# Patient Record
Sex: Female | Born: 1985 | Race: White | Hispanic: No | Marital: Single | Smoking: Never smoker
Health system: Southern US, Community
[De-identification: ages and names within clinical notes are randomized; demographics above are authoritative.]

## PROBLEM LIST (undated history)

## (undated) DIAGNOSIS — G4730 Sleep apnea, unspecified: Secondary | ICD-10-CM

## (undated) DIAGNOSIS — F419 Anxiety disorder, unspecified: Secondary | ICD-10-CM

## (undated) DIAGNOSIS — F32A Depression, unspecified: Secondary | ICD-10-CM

## (undated) DIAGNOSIS — F329 Major depressive disorder, single episode, unspecified: Secondary | ICD-10-CM

## (undated) DIAGNOSIS — M545 Low back pain, unspecified: Secondary | ICD-10-CM

## (undated) DIAGNOSIS — I219 Acute myocardial infarction, unspecified: Secondary | ICD-10-CM

## (undated) HISTORY — PX: WISDOM TOOTH EXTRACTION: SHX21

## (undated) HISTORY — DX: Acute myocardial infarction, unspecified: I21.9

---

## 2005-01-03 DIAGNOSIS — I219 Acute myocardial infarction, unspecified: Secondary | ICD-10-CM

## 2005-01-03 HISTORY — PX: CARDIAC CATHETERIZATION: SHX172

## 2005-01-03 HISTORY — DX: Acute myocardial infarction, unspecified: I21.9

## 2008-05-28 ENCOUNTER — Emergency Department (HOSPITAL_COMMUNITY): Admission: EM | Admit: 2008-05-28 | Discharge: 2008-05-28 | Payer: Self-pay | Admitting: Emergency Medicine

## 2010-04-13 LAB — POCT I-STAT, CHEM 8
BUN: 13 mg/dL (ref 6–23)
Creatinine, Ser: 0.7 mg/dL (ref 0.4–1.2)
Hemoglobin: 13.9 g/dL (ref 12.0–15.0)
Potassium: 3.3 mEq/L — ABNORMAL LOW (ref 3.5–5.1)
Sodium: 138 mEq/L (ref 135–145)
TCO2: 23 mmol/L (ref 0–100)

## 2010-04-13 LAB — POCT CARDIAC MARKERS
CKMB, poc: <1 ng/mL — ABNORMAL LOW (ref 1.0–8.0)
Myoglobin, poc: 38.4 ng/mL (ref 12–200)

## 2010-04-13 LAB — POCT PREGNANCY, URINE: Preg Test, Ur: NEGATIVE

## 2010-09-18 IMAGING — CR DG CHEST 2V
2 series · 2 of 2 positions shown · non-contrast
Comparison: None

CLINICAL DATA: Chest pain

CHEST - 2 VIEW

[w chest pa]
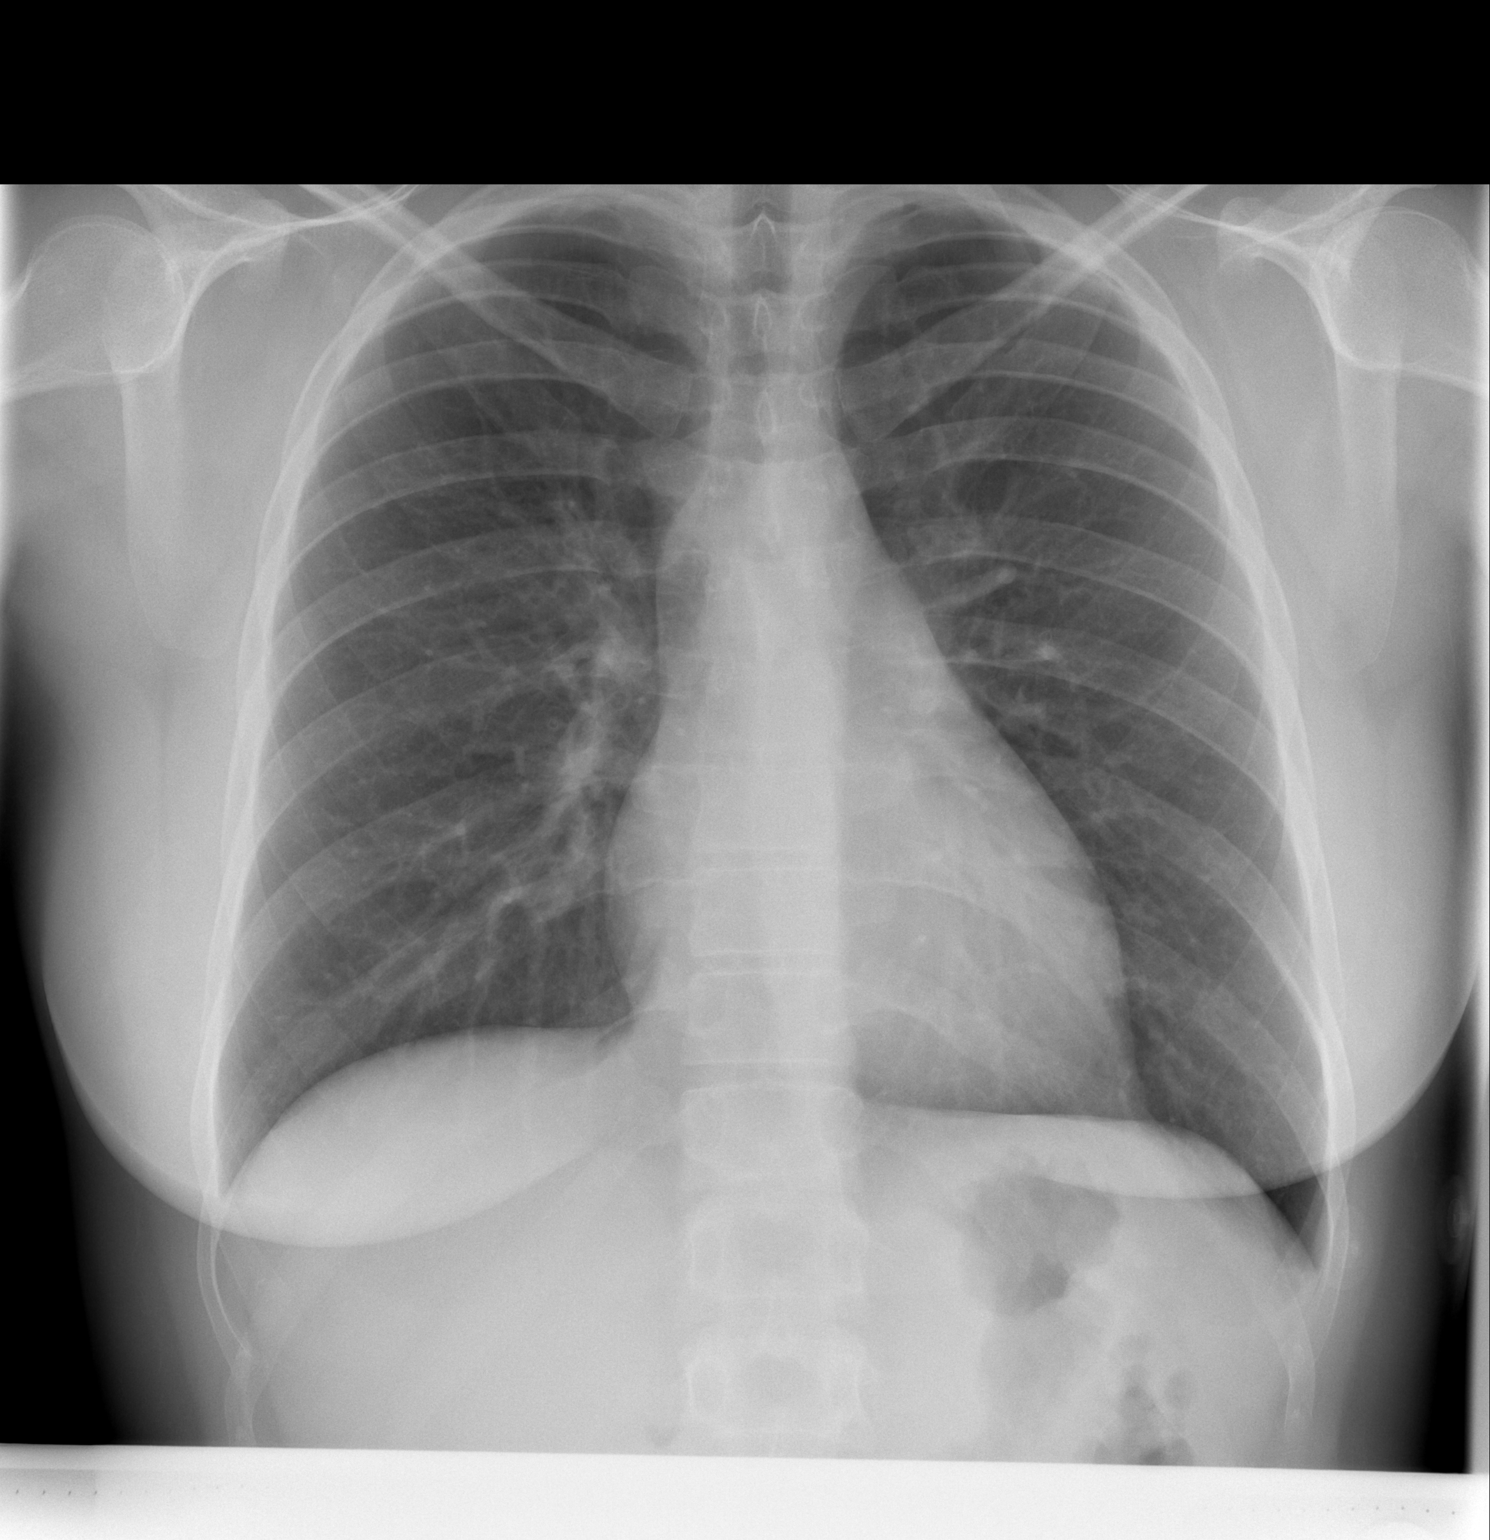

[w chest lat]
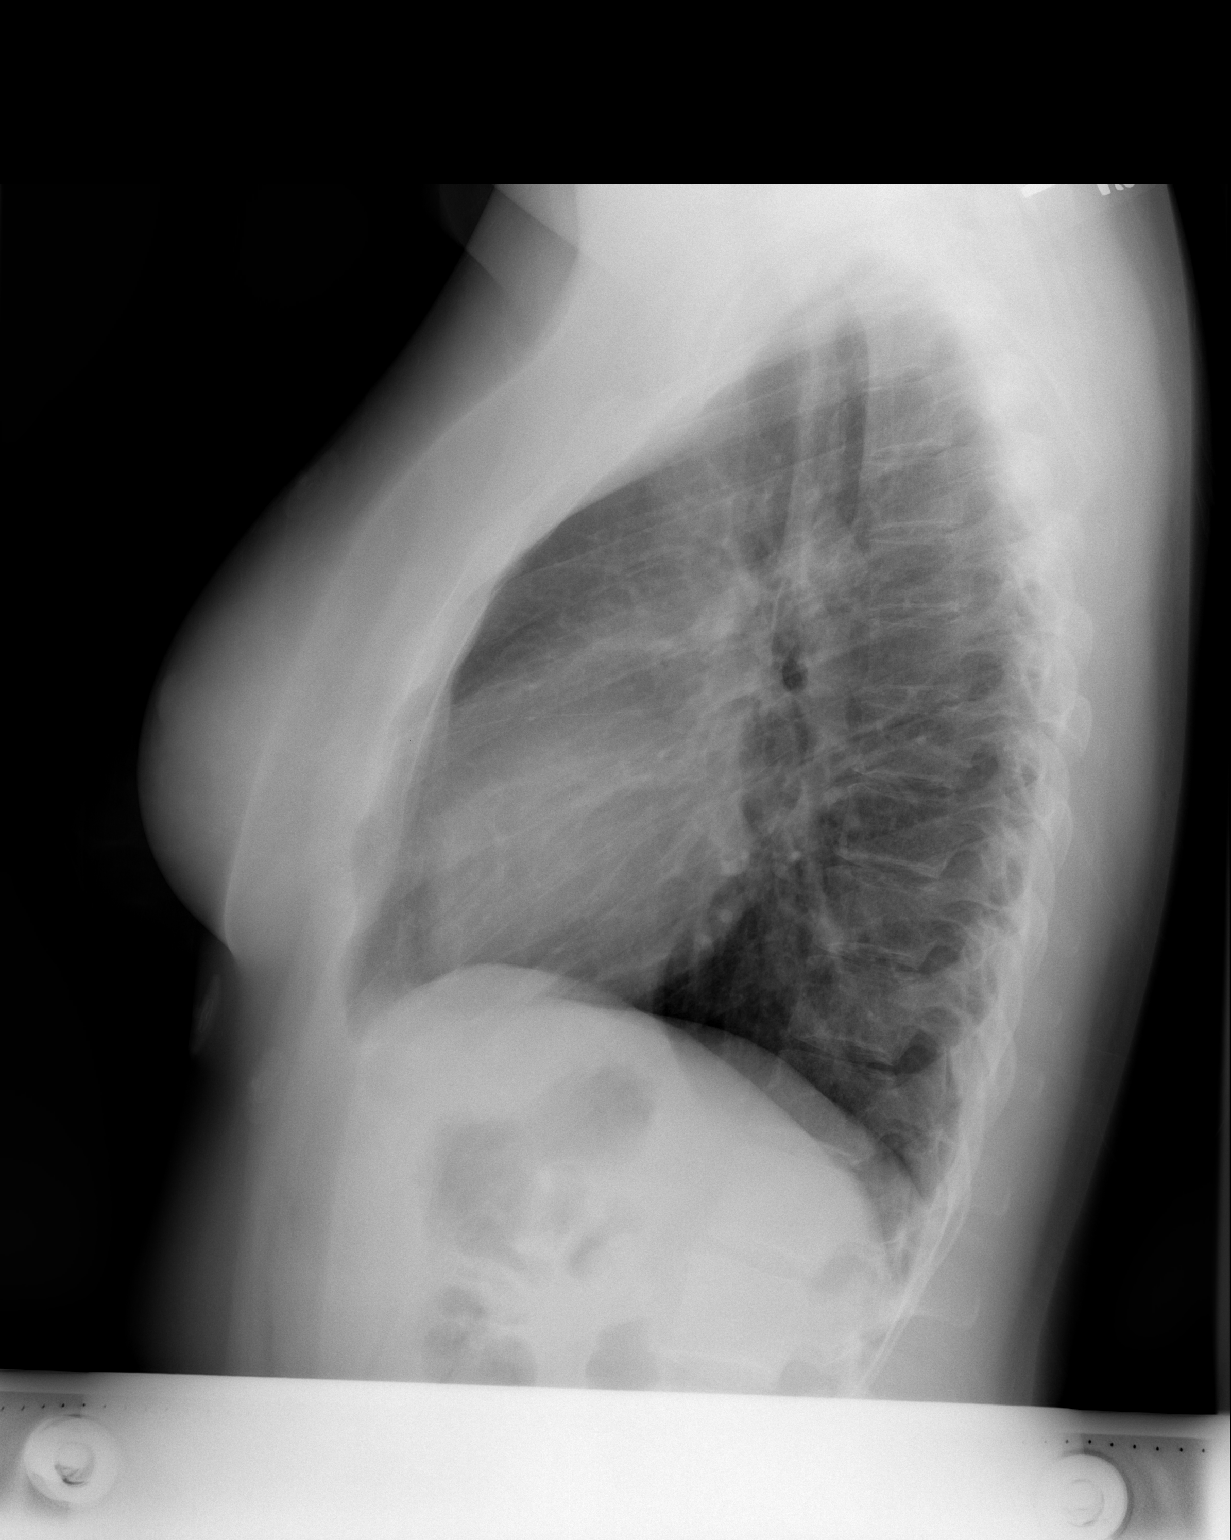

[2 of 2 positions shown; findings below may reference images not displayed]

FINDINGS: The abdomen and pelvis were shielded.

Heart and mediastinal contours normal.  Lungs clear.  No pleural
fluid.  Osseous structures and soft tissues unremarkable.
IMPRESSION: No acute or significant findings.

## 2013-10-07 ENCOUNTER — Ambulatory Visit (INDEPENDENT_AMBULATORY_CARE_PROVIDER_SITE_OTHER): Payer: PRIVATE HEALTH INSURANCE | Admitting: Physician Assistant

## 2013-10-07 VITALS — BP 122/76 | HR 84 | Temp 98.2°F | Resp 20 | Ht 61.5 in | Wt 186.0 lb

## 2013-10-07 DIAGNOSIS — R059 Cough, unspecified: Secondary | ICD-10-CM

## 2013-10-07 DIAGNOSIS — R109 Unspecified abdominal pain: Secondary | ICD-10-CM

## 2013-10-07 DIAGNOSIS — J069 Acute upper respiratory infection, unspecified: Secondary | ICD-10-CM

## 2013-10-07 DIAGNOSIS — R05 Cough: Secondary | ICD-10-CM

## 2013-10-07 MED ORDER — HYDROCOD POLST-CHLORPHEN POLST 10-8 MG/5ML PO LQCR: 5.0000 mL | Freq: Two times a day (BID) | ORAL | Status: DC | PRN

## 2013-10-07 MED ORDER — IPRATROPIUM BROMIDE 0.03 % NA SOLN: 2.0000 | Freq: Two times a day (BID) | NASAL | Status: DC

## 2013-10-07 MED ORDER — BENZONATATE 100 MG PO CAPS: 100.0000 mg | ORAL_CAPSULE | Freq: Three times a day (TID) | ORAL | Status: DC | PRN

## 2013-10-07 MED ORDER — IBUPROFEN 600 MG PO TABS: 600.0000 mg | ORAL_TABLET | Freq: Three times a day (TID) | ORAL | Status: DC | PRN

## 2013-10-07 NOTE — Patient Instructions (Signed)

## 2013-10-07 NOTE — Progress Notes (Signed)
Subjective:    Patient ID: Beth EpsteinMaranda A Piehl, female    DOB: 11/25/1985, 28 y.o.   MRN: [id number]  HPI Patient presents to clinic for worsening sore throat and cough. States that she was feeling weak on Thursday (10/03/13) and by Friday had cough sore throat, and rhinorrhea. Symptoms were exacerbated when she started her menstrual cycle on Saturday and has had the "worst period cramps ever". She left work Sunday due to worsening of symptoms. 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 Has tried guaifenesin/codeine and alka selzter each once for without relief. She has tried Pamprin x2, Midol x2, and Vicodin x1 for cramps. Vicodin mildly relieved symptoms.    Review of Systems  Constitutional: Positive for activity change, appetite change and fatigue. Negative for fever and chills.  HENT: Positive for congestion (chest, nasal), ear pain, postnasal drip, rhinorrhea, sore throat and trouble swallowing (painful). Negative for ear discharge, facial swelling, sinus pressure and sneezing.   Eyes: Positive for discharge (watery). Negative for pain, redness and itching.  Respiratory: Positive for cough (productive) and shortness of breath (not getting enough air). Negative for chest tightness and wheezing.   Cardiovascular: Negative for chest pain and palpitations.  Gastrointestinal: Positive for nausea and abdominal pain (crampy, 8/10). Negative for vomiting, diarrhea and constipation.  Genitourinary: Positive for vaginal bleeding and vaginal pain. Negative for dysuria, urgency, frequency and flank pain.  Musculoskeletal: Negative for back pain, myalgias, neck pain and neck stiffness.  Skin: Negative for color change and rash.  Allergic/Immunologic: Negative for environmental allergies and food allergies.  Neurological: Positive for weakness. Negative for dizziness, light-headedness and headaches.       Objective:   Physical Exam  Constitutional: She is oriented to person, place, and time.  She appears well-developed and well-nourished. No distress.  HENT:  Head: Normocephalic and atraumatic.  Right Ear: Hearing, tympanic membrane, external ear and ear canal normal. No drainage, swelling or tenderness. Tympanic membrane is not injected, not scarred, not perforated, not erythematous, not retracted and not bulging. No middle ear effusion.  Left Ear: Hearing and external ear normal. No drainage, swelling or tenderness. No foreign bodies. Tympanic membrane is scarred. Tympanic membrane is not injected, not perforated, not erythematous, not retracted and not bulging.  No middle ear effusion.  Nose: Mucosal edema and rhinorrhea present. No nose lacerations, sinus tenderness, nasal deformity or septal deviation. No epistaxis.  No foreign bodies. Right sinus exhibits no maxillary sinus tenderness and no frontal sinus tenderness. Left sinus exhibits no maxillary sinus tenderness and no frontal sinus tenderness.  Mouth/Throat: Oropharynx is clear and moist.  Eyes: Conjunctivae and EOM are normal. Pupils are equal, round, and reactive to light. Right eye exhibits no discharge.  Neck: Neck supple. No JVD present.  Cardiovascular: Normal rate, regular rhythm and normal heart sounds.  Exam reveals no gallop and no friction rub.   No murmur heard. Pulmonary/Chest: Effort normal and breath sounds normal. No respiratory distress. She has no wheezes. She has no rales.  Abdominal: Soft. Bowel sounds are normal. She exhibits no distension and no mass. There is no tenderness.  Lymphadenopathy:    She has no cervical adenopathy.  Neurological: She is alert and oriented to person, place, and time.  Skin: Skin is warm and dry. No rash noted. She is not diaphoretic. No erythema. No pallor.   Blood pressure 122/76, pulse 84, temperature 98.2 F (36.8 C), temperature source Oral, resp. rate 20, height 5' 1.5" (1.562 m), weight 186 lb (84.369 kg), last menstrual  period 10/05/2013, SpO2 98.00%.     Assessment  & Plan:  1. Acute upper respiratory infection 2. Cough - ipratropium (ATROVENT) 0.03 % nasal spray; Place 2 sprays into both nostrils 2 (two) times daily.  Dispense: 30 mL; Refill: 0 - benzonatate (TESSALON) 100 MG capsule; Take 1-2 capsules (100-200 mg total) by mouth 3 (three) times daily as needed for cough.  Dispense: 40 capsule; Refill: 0 - chlorpheniramine-HYDROcodone (TUSSIONEX PENNKINETIC ER) 10-8 MG/5ML LQCR; Take 5 mLs by mouth every 12 (twelve) hours as needed for cough (cough).  Dispense: 100 mL; Refill: 0 - Gave coupon for Mucinex 1200 mg. - Encouraged to get plenty of fluid and rest.  3. Abdominal cramps - ibuprofen (ADVIL,MOTRIN) 600 MG tablet; Take 1 tablet (600 mg total) by mouth every 8 (eight) hours as needed.  Dispense: 30 tablet; Refill: 0   Seri Kimmer PA-C  [facility] 10/07/2013 12:10 PM

## 2013-10-07 NOTE — Progress Notes (Signed)
I have examined this patient along with Ms. Brewington, PA-C and agree.  

## 2017-04-04 ENCOUNTER — Encounter (HOSPITAL_COMMUNITY): Payer: Self-pay | Admitting: *Deleted

## 2017-04-11 NOTE — Patient Instructions (Addendum)
Your procedure is scheduled on Friday, April 26  Enter through the Main Entrance of [facility] at: 6 am  Pick up the phone at the desk and dial [phone].  Call this number if you have problems the morning of surgery: [phone].  Remember: Do NOT eat or Do NOT drink clear liquids (including water) after Thursday.  Take these medicines the morning of surgery with a SIP OF WATER: lexapro and klonopin if needed.  Stop herbal medications vitamin supplements and ibuprofen 5 days prior to surgery.  Do NOT wear jewelry (body piercing), metal hair clips/bobby pins, make-up, or nail polish. Do NOT wear lotions, powders, or perfumes.  You may wear deoderant. Do NOT shave for 48 hours prior to surgery. Do NOT bring valuables to the hospital..  Have a responsible adult drive you home and stay with you for 24 hours after your procedure.  Home with Phyll[facility] [place] cell [phone].

## 2017-04-17 ENCOUNTER — Other Ambulatory Visit: Payer: Self-pay

## 2017-04-17 ENCOUNTER — Encounter (HOSPITAL_COMMUNITY): Payer: Self-pay

## 2017-04-17 ENCOUNTER — Encounter (HOSPITAL_COMMUNITY)
Admission: RE | Admit: 2017-04-17 | Discharge: 2017-04-17 | Disposition: A | Discharge status: Home / Self Care | Payer: BLUE CROSS/BLUE SHIELD | Source: Ambulatory Visit | Attending: Obstetrics and Gynecology | Admitting: Obstetrics and Gynecology

## 2017-04-17 DIAGNOSIS — Z01812 Encounter for preprocedural laboratory examination: Secondary | ICD-10-CM | POA: Diagnosis present

## 2017-04-17 HISTORY — DX: Depression, unspecified: F32.A

## 2017-04-17 HISTORY — DX: Major depressive disorder, single episode, unspecified: F32.9

## 2017-04-17 HISTORY — DX: Sleep apnea, unspecified: G47.30

## 2017-04-17 HISTORY — DX: Low back pain, unspecified: M54.50

## 2017-04-17 HISTORY — DX: Low back pain: M54.5

## 2017-04-17 HISTORY — DX: Anxiety disorder, unspecified: F41.9

## 2017-04-17 LAB — CBC
HCT: 37 % (ref 36.0–46.0)
Hemoglobin: 12 g/dL (ref 12.0–15.0)
MCH: 27.3 pg (ref 26.0–34.0)
MCHC: 32.4 g/dL (ref 30.0–36.0)
MCV: 84.1 fL (ref 78.0–100.0)
Platelets: 360 10*3/uL (ref 150–400)
RBC: 4.4 MIL/uL (ref 3.87–5.11)
RDW: 15.3 % (ref 11.5–15.5)
WBC: 7.4 10*3/uL (ref 4.0–10.5)

## 2017-04-17 LAB — COMPREHENSIVE METABOLIC PANEL
ALBUMIN: 3.8 g/dL (ref 3.5–5.0)
ALT: 28 U/L (ref 14–54)
ANION GAP: 10 (ref 5–15)
AST: 26 U/L (ref 15–41)
Alkaline Phosphatase: 70 U/L (ref 38–126)
BUN: 12 mg/dL (ref 6–20)
CHLORIDE: 103 mmol/L (ref 101–111)
CO2: 24 mmol/L (ref 22–32)
Calcium: 9.1 mg/dL (ref 8.9–10.3)
Creatinine, Ser: 0.72 mg/dL (ref 0.44–1.00)
GFR calc Af Amer: >60 mL/min (ref >60)
GFR calc non Af Amer: >60 mL/min (ref >60)
Glucose, Bld: 96 mg/dL (ref 65–99)
POTASSIUM: 4.2 mmol/L (ref 3.5–5.1)
SODIUM: 137 mmol/L (ref 135–145)
Total Bilirubin: 0.8 mg/dL (ref 0.3–1.2)
Total Protein: 7.8 g/dL (ref 6.5–8.1)

## 2017-04-17 LAB — TYPE AND SCREEN
ABO/RH(D): A POS
Antibody Screen: NEGATIVE

## 2017-04-17 LAB — ABO/RH: ABO/RH(D): A POS

## 2017-04-24 NOTE — H&P (Addendum)
Mosie EpsteinMaranda A Horace is an 32 y.o. female. Presenting for scheduled surgery. 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 SIS shows possible <1cm polyp. [facility] and possible [facility].  She has a h/o an MI while on OCPs with neg heart cath and was cleared for surgery by her PCP.   Pertinent Gynecological History: Menses: irregular Bleeding: dysfunctional uterine bleeding Contraception: none DES exposure: denies Blood transfusions: none Sexually transmitted diseases: no past history Previous GYN Procedures: n/a  Last mammogram: n/a Last pap: normal Date: 2019 OB History: G0   Menstrual History: No LMP recorded (lmp unknown).     Past Medical History:  Diagnosis Date  . Anxiety   . Depression   . Heart attack (HCC) 2007   r/t birth control pills at age 32 yr old  . Low back pain   . Sleep apnea    uses cpap nightly    Past Surgical History:  Procedure Laterality Date  . CARDIAC CATHETERIZATION  2007   Hx heart attck r/t birth control pills - In [place]  . WISDOM TOOTH EXTRACTION     IN [place]    Family History  Problem Relation Age of Onset  . Hyperlipidemia Mother   . Hypertension Mother   . Heart disease Maternal Grandmother   . Hyperlipidemia Maternal Grandmother   . Hypertension Maternal Grandmother   . Stroke Maternal Grandmother   . Cancer Maternal Grandfather     Social History:  reports that she has never smoked. She has never used smokeless tobacco. She reports that she drinks about 1.8 - 2.4 oz of alcohol per week. She reports that she does not use drugs.  Allergies:  Allergies  Allergen Reactions  . Latex Rash    No medications prior to admission.    ROS  There were no vitals taken for this visit. Physical Exam  Gen: well appearing, NAD CV: Reg rate Pulm: NWOB Abd: soft, nondistended, nontender, no masses GYN: uterus 7 week size, no adnexa ttp/CMT Ext: No edema  b/l   No results found for this or any previous visit (from the past 24 hour(s)).  No results found.  Assessment/Plan: 31yo w/h/o AUB + primary infertility, possible polyps on SIS here for planned surgery. Options were reviewed with her and [facility] and possible [facility]. She has a h/o MI on OCPs and underwent appropriate medical clearance for surgery.  Risks discussed including infection, bleeding, damage to surrounding structures, need for additional procedures, postoperative DVT and meed for transfusion. Understands possibility of hyperplasia/carcinoma. All questions answered. Consent signed.    Madelaine Etiennelise Jennifer Mora Pedraza 04/24/2017, 10:02 AM   No updates to above H&P. Patient arrived NPO and was consented in PACU. Risks discussed including infection, bleeding, damage to surrounding structures, need for additional procedures, and fluid overload. All questions answered. Consent signed. Proceed with above surgery.   Belva AgeeElise Reann Dobias MD

## 2017-04-26 NOTE — Anesthesia Preprocedure Evaluation (Signed)
Anesthesia Evaluation  Patient identified by MRN, date of birth, ID band Patient awake    Reviewed: Allergy & Precautions, H&P , Patient's Chart, lab work & pertinent test results  Airway Mallampati: II  TM Distance: >3 FB Neck ROM: full    Dental  (+) Teeth Intact   Pulmonary sleep apnea ,    breath sounds clear to auscultation       Cardiovascular  Rhythm:regular Rate:Normal     Neuro/Psych    GI/Hepatic   Endo/Other  Morbid obesity  Renal/GU      Musculoskeletal   Abdominal   Peds  Hematology   Anesthesia Other Findings       Reproductive/Obstetrics                             Anesthesia Physical Anesthesia Plan  ASA: III  Anesthesia Plan: General   Post-op Pain Management:    Induction: Intravenous  PONV Risk Score and Plan: 3 and Treatment may vary due to age or medical condition, Scopolamine patch - Pre-op, Dexamethasone and Ondansetron  Airway Management Planned: LMA  Additional Equipment:   Intra-op Plan:   Post-operative Plan:   Informed Consent:   Plan Discussed with: CRNA and Surgeon  Anesthesia Plan Comments: ( )        Anesthesia Quick Evaluation

## 2017-04-27 ENCOUNTER — Encounter (HOSPITAL_COMMUNITY): Payer: Self-pay

## 2017-04-27 NOTE — Pre-Procedure Instructions (Signed)
Patient had a heart attack in 2007 ([place]) related to birth control pills.  Patient has not had any problems since.  Spoke with anesthesia.  Only need to obtain EKG.  EKG ok.  No orders given.

## 2017-04-28 ENCOUNTER — Ambulatory Visit (HOSPITAL_COMMUNITY): Payer: BLUE CROSS/BLUE SHIELD | Admitting: Anesthesiology

## 2017-04-28 ENCOUNTER — Ambulatory Visit (HOSPITAL_COMMUNITY)
Admission: RE | Admit: 2017-04-28 | Discharge: 2017-04-28 | Disposition: A | Discharge status: Home / Self Care | Payer: BLUE CROSS/BLUE SHIELD | Source: Ambulatory Visit | Attending: Obstetrics and Gynecology | Admitting: Obstetrics and Gynecology

## 2017-04-28 ENCOUNTER — Encounter (HOSPITAL_COMMUNITY)
Admission: RE | Disposition: A | Discharge status: Home / Self Care | Payer: Self-pay | Source: Ambulatory Visit | Attending: Obstetrics and Gynecology

## 2017-04-28 ENCOUNTER — Encounter (HOSPITAL_COMMUNITY): Payer: Self-pay | Admitting: Certified Registered Nurse Anesthetist

## 2017-04-28 DIAGNOSIS — F419 Anxiety disorder, unspecified: Secondary | ICD-10-CM | POA: Insufficient documentation

## 2017-04-28 DIAGNOSIS — N939 Abnormal uterine and vaginal bleeding, unspecified: Secondary | ICD-10-CM | POA: Diagnosis present

## 2017-04-28 DIAGNOSIS — F329 Major depressive disorder, single episode, unspecified: Secondary | ICD-10-CM | POA: Diagnosis not present

## 2017-04-28 DIAGNOSIS — N840 Polyp of corpus uteri: Secondary | ICD-10-CM | POA: Insufficient documentation

## 2017-04-28 DIAGNOSIS — G4730 Sleep apnea, unspecified: Secondary | ICD-10-CM | POA: Insufficient documentation

## 2017-04-28 DIAGNOSIS — Z79899 Other long term (current) drug therapy: Secondary | ICD-10-CM | POA: Diagnosis not present

## 2017-04-28 HISTORY — PX: DILATATION & CURETTAGE/HYSTEROSCOPY WITH MYOSURE: SHX6511

## 2017-04-28 LAB — PREGNANCY, URINE: PREG TEST UR: NEGATIVE

## 2017-04-28 SURGERY — DILATATION & CURETTAGE/HYSTEROSCOPY WITH MYOSURE
Anesthesia: General

## 2017-04-28 MED ORDER — ONDANSETRON HCL 4 MG/2ML IJ SOLN
INTRAMUSCULAR | Status: DC | PRN
  Administered 2017-04-28: 4 mg via INTRAVENOUS

## 2017-04-28 MED ORDER — SCOPOLAMINE 1 MG/3DAYS TD PT72
MEDICATED_PATCH | TRANSDERMAL | Status: AC
  Filled 2017-04-28: qty 1

## 2017-04-28 MED ORDER — PROPOFOL 10 MG/ML IV BOLUS
INTRAVENOUS | Status: DC | PRN
  Administered 2017-04-28: 180 mg via INTRAVENOUS
  Administered 2017-04-28: 60 mg via INTRAVENOUS

## 2017-04-28 MED ORDER — FENTANYL CITRATE (PF) 100 MCG/2ML IJ SOLN
INTRAMUSCULAR | Status: DC | PRN
  Administered 2017-04-28: 50 ug via INTRAVENOUS

## 2017-04-28 MED ORDER — DEXAMETHASONE SODIUM PHOSPHATE 10 MG/ML IJ SOLN
INTRAMUSCULAR | Status: DC | PRN
  Administered 2017-04-28: 4 mg via INTRAVENOUS

## 2017-04-28 MED ORDER — KETOROLAC TROMETHAMINE 30 MG/ML IJ SOLN
INTRAMUSCULAR | Status: AC
  Filled 2017-04-28: qty 1

## 2017-04-28 MED ORDER — FENTANYL CITRATE (PF) 100 MCG/2ML IJ SOLN
INTRAMUSCULAR | Status: AC
  Filled 2017-04-28: qty 2

## 2017-04-28 MED ORDER — FENTANYL CITRATE (PF) 100 MCG/2ML IJ SOLN
25.0000 ug | INTRAMUSCULAR | Status: DC | PRN
  Administered 2017-04-28: 50 ug via INTRAVENOUS

## 2017-04-28 MED ORDER — ACETAMINOPHEN 160 MG/5ML PO SOLN
ORAL | Status: AC
  Filled 2017-04-28: qty 40.6

## 2017-04-28 MED ORDER — LIDOCAINE HCL 1 % IJ SOLN
INTRAMUSCULAR | Status: AC
  Filled 2017-04-28: qty 20

## 2017-04-28 MED ORDER — KETOROLAC TROMETHAMINE 30 MG/ML IJ SOLN
INTRAMUSCULAR | Status: DC | PRN
  Administered 2017-04-28: 30 mg via INTRAVENOUS

## 2017-04-28 MED ORDER — LIDOCAINE HCL (CARDIAC) PF 100 MG/5ML IV SOSY
PREFILLED_SYRINGE | INTRAVENOUS | Status: DC | PRN
  Administered 2017-04-28: 50 mg via INTRAVENOUS

## 2017-04-28 MED ORDER — PROPOFOL 10 MG/ML IV BOLUS
INTRAVENOUS | Status: AC
  Filled 2017-04-28: qty 20

## 2017-04-28 MED ORDER — LACTATED RINGERS IV SOLN
INTRAVENOUS | Status: DC
  Administered 2017-04-28: 1000 mL via INTRAVENOUS
  Administered 2017-04-28 (×2): via INTRAVENOUS

## 2017-04-28 MED ORDER — ACETAMINOPHEN 160 MG/5ML PO SOLN
975.0000 mg | Freq: Once | ORAL | Status: AC
  Administered 2017-04-28: 975 mg via ORAL

## 2017-04-28 MED ORDER — ONDANSETRON HCL 4 MG/2ML IJ SOLN
INTRAMUSCULAR | Status: AC
  Filled 2017-04-28: qty 2

## 2017-04-28 MED ORDER — DEXAMETHASONE SODIUM PHOSPHATE 4 MG/ML IJ SOLN
INTRAMUSCULAR | Status: AC
  Filled 2017-04-28: qty 1

## 2017-04-28 MED ORDER — LIDOCAINE HCL 1 % IJ SOLN
INTRAMUSCULAR | Status: DC | PRN
  Administered 2017-04-28: 10 mL

## 2017-04-28 MED ORDER — FENTANYL CITRATE (PF) 100 MCG/2ML IJ SOLN
INTRAMUSCULAR | Status: AC
  Administered 2017-04-28: 50 ug via INTRAVENOUS
  Filled 2017-04-28: qty 2

## 2017-04-28 MED ORDER — SCOPOLAMINE 1 MG/3DAYS TD PT72
1.0000 | MEDICATED_PATCH | Freq: Once | TRANSDERMAL | Status: DC
  Administered 2017-04-28: 1.5 mg via TRANSDERMAL

## 2017-04-28 MED ORDER — SODIUM CHLORIDE 0.9 % IR SOLN
Status: DC | PRN
  Administered 2017-04-28: 3000 mL

## 2017-04-28 MED ORDER — MIDAZOLAM HCL 2 MG/2ML IJ SOLN
INTRAMUSCULAR | Status: AC
  Filled 2017-04-28: qty 2

## 2017-04-28 SURGICAL SUPPLY — 21 items
CANISTER SUCTION 2500CC (MISCELLANEOUS) ×3 IMPLANT
CATH ROBINSON RED A/P 16FR (CATHETERS) ×3 IMPLANT
DEVICE MYOSURE LITE (MISCELLANEOUS) ×3 IMPLANT
DEVICE MYOSURE REACH (MISCELLANEOUS) IMPLANT
DILATOR CANAL MILEX (MISCELLANEOUS) IMPLANT
FILTER ARTHROSCOPY CONVERTOR (FILTER) ×3 IMPLANT
GLOVE BIO SURGEON STRL SZ 6.5 (GLOVE) ×2 IMPLANT
GLOVE BIO SURGEONS STRL SZ 6.5 (GLOVE) ×1
GLOVE BIOGEL PI IND STRL 6.5 (GLOVE) ×1 IMPLANT
GLOVE BIOGEL PI IND STRL 7.0 (GLOVE) ×1 IMPLANT
GLOVE BIOGEL PI INDICATOR 6.5 (GLOVE) ×2
GLOVE BIOGEL PI INDICATOR 7.0 (GLOVE) ×2
GOWN SPEC L3 MED W/TWL (GOWN DISPOSABLE) ×6 IMPLANT
MYOSURE XL FIBROID REM (MISCELLANEOUS)
PACK VAGINAL MINOR WOMEN LF (CUSTOM PROCEDURE TRAY) ×3 IMPLANT
PAD OB MATERNITY 4.3X12.25 (PERSONAL CARE ITEMS) ×3 IMPLANT
SEAL ROD LENS SCOPE MYOSURE (ABLATOR) ×3 IMPLANT
SYSTEM TISS REMOVAL MYSR XL RM (MISCELLANEOUS) IMPLANT
TOWEL OR 17X24 6PK STRL BLUE (TOWEL DISPOSABLE) ×6 IMPLANT
TUBING AQUILEX INFLOW (TUBING) ×3 IMPLANT
TUBING AQUILEX OUTFLOW (TUBING) ×3 IMPLANT

## 2017-04-28 NOTE — Transfer of Care (Signed)
Immediate Anesthesia Transfer of Care Note  Patient: Beth Merritt  Procedure(s) Performed: DILATATION & CURETTAGE/HYSTEROSCOPY WITH MYOSURE (N/A )  Patient Location: PACU  Anesthesia Type:General  Level of Consciousness: awake, alert  and oriented  Airway & Oxygen Therapy: Patient Spontanous Breathing and Patient connected to nasal cannula oxygen  Post-op Assessment: Report given to RN and Post -op Vital signs reviewed and stable  Post vital signs: Reviewed and stable  Last Vitals:  Vitals Value Taken Time  BP 127/75 04/28/2017  8:11 AM  Temp    Pulse 76 04/28/2017  8:13 AM  Resp 15 04/28/2017  8:13 AM  SpO2 99 % 04/28/2017  8:13 AM  Vitals shown include unvalidated device data.  Last Pain:  Vitals:   04/28/17 0624  TempSrc: Oral  PainSc: 2       Patients Stated Pain Goal: 2 (04/28/17 [id number])  Complications: No apparent anesthesia complications

## 2017-04-28 NOTE — Anesthesia Procedure Notes (Signed)
Procedure Name: LMA Insertion Date/Time: 04/28/2017 7:40 AM Performed by: Cleda ClarksBrowder, Divya Munshi R, CRNA Pre-anesthesia Checklist: Patient identified, Emergency Drugs available, Suction available and Patient being monitored Patient Re-evaluated:Patient Re-evaluated prior to induction Oxygen Delivery Method: Circle system utilized Preoxygenation: Pre-oxygenation with 100% oxygen Induction Type: IV induction Ventilation: Mask ventilation without difficulty LMA: LMA inserted LMA Size: 4.0 Number of attempts: 1 Airway Equipment and Method: Bite block Placement Confirmation: positive ETCO2 Tube secured with: Tape Dental Injury: Teeth and Oropharynx as per pre-operative assessment

## 2017-04-28 NOTE — Anesthesia Postprocedure Evaluation (Signed)
Anesthesia Post Note  Patient: Beth Merritt  Procedure(s) Performed: DILATATION & CURETTAGE/HYSTEROSCOPY WITH MYOSURE (N/A )     Patient location during evaluation: PACU Anesthesia Type: General Level of consciousness: awake and alert Pain management: pain level controlled Vital Signs Assessment: post-procedure vital signs reviewed and stable Respiratory status: spontaneous breathing, nonlabored ventilation, respiratory function stable and patient connected to nasal cannula oxygen Cardiovascular status: blood pressure returned to baseline and stable Postop Assessment: no apparent nausea or vomiting Anesthetic complications: no    Last Vitals:  Vitals:   04/28/17 0852 04/28/17 0938  BP: 120/85 126/75  Pulse: 74 67  Resp: 18 20  Temp:    SpO2: 95% 96%    Last Pain:  Vitals:   04/28/17 0845  TempSrc:   PainSc: 1    Pain Goal: Patients Stated Pain Goal: 2 (04/28/17 0624)               Jiles GarterJACKSON,Brazos Sandoval EDWARD

## 2017-04-28 NOTE — Brief Op Note (Signed)
04/28/2017  8:04 AM  PATIENT:  Beth Merritt  32 y.o. female  PRE-OPERATIVE DIAGNOSIS:  hyperplasia  POST-OPERATIVE DIAGNOSIS:  hyperplasia  PROCEDURE:  Procedure(s) with comments: DILATATION & CURETTAGE/HYSTEROSCOPY WITH MYOSURE (N/A) - patient has history of MI in [place]; [facility]  SURGEON:  Surgeon(s) and Role:    * Calen Geister, Madelaine EtienneElise Jennifer, MD - Primary  PHYSICIAN ASSISTANT:   ASSISTANTS: none   ANESTHESIA:   IV sedation  EBL:  10 mL   BLOOD ADMINISTERED:none  DRAINS: Urinary Catheter (Foley)   LOCAL MEDICATIONS USED:  LIDOCAINE  and Amount: 10 ml  SPECIMEN:  Source of Specimen:  endometrial curettings, endometrial polyp  DISPOSITION OF SPECIMEN:  PATHOLOGY  COUNTS:  YES  TOURNIQUET:  * No tourniquets in log *  DICTATION: .Note written in EPIC  PLAN OF CARE: Discharge to home after PACU  PATIENT DISPOSITION:  PACU - hemodynamically stable.   Delay start of Pharmacological VTE agent (>24hrs) due to surgical blood loss or risk of bleeding: not applicable

## 2017-04-28 NOTE — Op Note (Signed)
PREOPERATIVE DIAGNOSES: 1. AUB 2. Endometrial polyp 3. Simple hyperplasia w/out atypia  POSTOPERATIVE DIAGNOSES: Same  PROCEDURE PERFORMED: Dilation, curretage, polypectomy, hysteroscopy  SURGEON: Dr. Belva AgeeElise Harwood Nall  ANESTHESIA: IV sedation  ESTIMATED BLOOD LOSS: 20cc.  COMPLICATIONS: None  TUBES: None.  DRAINS: None  PATHOLOGY: Endometrial curretings and polyp  FINDINGS: On exam, under anesthesia, normal appearing vulva and vagina, 7 week sized uterus  Operative findings demonstrated two polyps and a plethora of fluffy endometrium and an endometrial polyp. B/l ostia visualized  Procedure: The patient was taken to the operating room where she was properly prepped and draped in sterile manner under general anesthesia. After bimanual examination, the cervix was exposed with a weighted vaginal speculum and the anterior lip of the cervix grasped with a tenaculum.  The endocervical canal was then progressively dilated to 8mm. The hysteroscope was then introduced into the uterine cavity using sterile saline solution as a distending media and with attached video camera. The endometrial cavity was distended with fluids and the cavity with the b/l ostia were visualized. A plethora of proliferative appearing endometrium was noted in additional to an intracavitary polyp. Myosure was used to remove the polyp. Hysteroscope removed from the cavity. Sharp curretage was then performed.The sponge and lap counts were correct times 2 at this time. The patient's procedure was terminated. We then awakened her. She was sent to the Recovery Room in good condition.    Belva AgeeElise Loghan Subia MD

## 2017-04-28 NOTE — Discharge Instructions (Signed)
°  Post Anesthesia Home Care Instructions  Activity: Get plenty of rest for the remainder of the day. A responsible individual must stay with you for 24 hours following the procedure.  For the next 24 hours, DO NOT: -Drive a car -Operate machineryAdvertising copywriter -Drink alcoholic beverages -Take any medication unless instructed by your physician -Make any legal decisions or sign important papers.  Meals: Start with liquid foods such as gelatin or soup. Progress to regular foods as tolerated. Avoid greasy, spicy, heavy foods. If nausea and/or vomiting occur, drink only clear liquids until the nausea and/or vomiting subsides. Call your physician if vomiting continues.  Special Instructions/Symptoms: Your throat may feel dry or sore from the anesthesia or the breathing tube placed in your throat during surgery. If this causes discomfort, gargle with warm salt water. The discomfort should disappear within 24 hours.  If you had a scopolamine patch placed behind your ear for the management of post- operative nausea and/or vomiting:  1. The medication in the patch is effective for 72 hours, after which it should be removed.  Wrap patch in a tissue and discard in the trash. Wash hands thoroughly with soap and water. 2. You may remove the patch earlier than 72 hours if you experience unpleasant side effects which may include dry mouth, dizziness or visual disturbances. 3. Avoid touching the patch. Wash your hands with soap and water after contact with the patch.   DISCHARGE INSTRUCTIONS: HYSTEROSCOPY / ENDOMETRIAL ABLATION The following instructions have been prepared to help you care for yourself upon your return home.  May Remove Scop patch on or before Monday, April 29th  May take Ibuprofen after 2Pm today  May take stool softner while taking narcotic pain medication to prevent constipation.  Drink plenty of water.  Personal hygiene:  Use sanitary pads for vaginal drainage, not tampons.  Shower the day  after your procedure.  NO tub baths, pools or Jacuzzis for 2-3 weeks.  Wipe front to back after using the bathroom.  Activity and limitations:  Do NOT drive or operate any equipment for 24 hours. The effects of anesthesia are still present and drowsiness may result.  Do NOT rest in bed all day.  Walking is encouraged.  Walk up and down stairs slowly.  You may resume your normal activity in one to two days or as indicated by your physician. Sexual activity: NO intercourse for at least 2 weeks after the procedure, or as indicated by your Doctor.  Diet: Eat a light meal as desired this evening. You may resume your usual diet tomorrow.  Return to Work: You may resume your work activities in one to two days or as indicated by Therapist, sportsyour Doctor.  What to expect after your surgery: Expect to have vaginal bleeding/discharge for 2-3 days and spotting for up to 10 days. It is not unusual to have soreness for up to 1-2 weeks. You may have a slight burning sensation when you urinate for the first day. Mild cramps may continue for a couple of days. You may have a regular period in 2-6 weeks.  Call your doctor for any of the following:  Excessive vaginal bleeding or clotting, saturating and changing one pad every hour.  Inability to urinate 6 hours after discharge from hospital.  Pain not relieved by pain medication.  Fever of 100.4 F or greater.  Unusual vaginal discharge or odor.

## 2017-04-29 ENCOUNTER — Encounter (HOSPITAL_COMMUNITY): Payer: Self-pay | Admitting: Obstetrics and Gynecology

## 2018-03-09 ENCOUNTER — Emergency Department (HOSPITAL_COMMUNITY)
Admission: EM | Admit: 2018-03-09 | Discharge: 2018-03-09 | Disposition: A | Discharge status: Home / Self Care | Payer: BLUE CROSS/BLUE SHIELD | Attending: Emergency Medicine | Admitting: Emergency Medicine

## 2018-03-09 ENCOUNTER — Other Ambulatory Visit: Payer: Self-pay

## 2018-03-09 ENCOUNTER — Emergency Department (HOSPITAL_COMMUNITY): Payer: BLUE CROSS/BLUE SHIELD

## 2018-03-09 ENCOUNTER — Encounter (HOSPITAL_COMMUNITY): Payer: Self-pay | Admitting: Emergency Medicine

## 2018-03-09 DIAGNOSIS — R079 Chest pain, unspecified: Secondary | ICD-10-CM | POA: Diagnosis present

## 2018-03-09 DIAGNOSIS — Z79899 Other long term (current) drug therapy: Secondary | ICD-10-CM | POA: Insufficient documentation

## 2018-03-09 LAB — COMPREHENSIVE METABOLIC PANEL
ALT: 31 U/L (ref 0–44)
AST: 33 U/L (ref 15–41)
Albumin: 3.7 g/dL (ref 3.5–5.0)
Alkaline Phosphatase: 81 U/L (ref 38–126)
Anion gap: 10 (ref 5–15)
BUN: 10 mg/dL (ref 6–20)
CO2: 20 mmol/L — AB (ref 22–32)
Calcium: 9.1 mg/dL (ref 8.9–10.3)
Chloride: 104 mmol/L (ref 98–111)
Creatinine, Ser: 0.77 mg/dL (ref 0.44–1.00)
GFR calc non Af Amer: >60 mL/min (ref >60)
Glucose, Bld: 113 mg/dL — ABNORMAL HIGH (ref 70–99)
Potassium: 4.1 mmol/L (ref 3.5–5.1)
SODIUM: 134 mmol/L — AB (ref 135–145)
Total Bilirubin: 0.8 mg/dL (ref 0.3–1.2)
Total Protein: 7.4 g/dL (ref 6.5–8.1)

## 2018-03-09 LAB — CBC WITH DIFFERENTIAL/PLATELET
Abs Immature Granulocytes: 0.03 10*3/uL (ref 0.00–0.07)
Basophils Absolute: 0 10*3/uL (ref 0.0–0.1)
Basophils Relative: 1 %
EOS ABS: 0.3 10*3/uL (ref 0.0–0.5)
EOS PCT: 3 %
HCT: 36.6 % (ref 36.0–46.0)
Hemoglobin: 11.3 g/dL — ABNORMAL LOW (ref 12.0–15.0)
Immature Granulocytes: 0 %
Lymphocytes Relative: 32 %
Lymphs Abs: 2.6 10*3/uL (ref 0.7–4.0)
MCH: 26.9 pg (ref 26.0–34.0)
MCHC: 30.9 g/dL (ref 30.0–36.0)
MCV: 87.1 fL (ref 80.0–100.0)
Monocytes Absolute: 0.7 10*3/uL (ref 0.1–1.0)
Monocytes Relative: 9 %
Neutro Abs: 4.4 10*3/uL (ref 1.7–7.7)
Neutrophils Relative %: 55 %
Platelets: 429 10*3/uL — ABNORMAL HIGH (ref 150–400)
RBC: 4.2 MIL/uL (ref 3.87–5.11)
RDW: 13.1 % (ref 11.5–15.5)
WBC: 8 10*3/uL (ref 4.0–10.5)
nRBC: 0 % (ref 0.0–0.2)

## 2018-03-09 LAB — LIPASE, BLOOD: Lipase: 27 U/L (ref 11–51)

## 2018-03-09 LAB — I-STAT TROPONIN, ED
Troponin i, poc: 0.01 ng/mL (ref 0.00–0.08)
Troponin i, poc: 0.01 ng/mL (ref 0.00–0.08)

## 2018-03-09 MED ORDER — LORAZEPAM 2 MG/ML IJ SOLN
1.0000 mg | Freq: Once | INTRAMUSCULAR | Status: AC
  Administered 2018-03-09: 1 mg via INTRAVENOUS
  Filled 2018-03-09: qty 1

## 2018-03-09 MED ORDER — KETOROLAC TROMETHAMINE 30 MG/ML IJ SOLN
15.0000 mg | Freq: Once | INTRAMUSCULAR | Status: AC
  Administered 2018-03-09: 15 mg via INTRAVENOUS
  Filled 2018-03-09: qty 1

## 2018-03-09 MED ORDER — DIAZEPAM 2 MG PO TABS: 2.0000 mg | ORAL_TABLET | Freq: Four times a day (QID) | ORAL | 0 refills | Status: AC | PRN

## 2018-03-09 MED ORDER — SODIUM CHLORIDE 0.9 % IV SOLN: INTRAVENOUS | Status: DC

## 2018-03-09 MED ORDER — IOHEXOL 350 MG/ML SOLN
100.0000 mL | Freq: Once | INTRAVENOUS | Status: AC | PRN
  Administered 2018-03-09: 100 mL via INTRAVENOUS

## 2018-03-09 NOTE — ED Provider Notes (Signed)
[facility] EMERGENCY DEPARTMENT Provider Note   CSN: [id number] Arrival date & time: 03/09/18  0913    History   Chief Complaint Chief Complaint  Patient presents with  . Chest Pain    HPI Beth Merritt is a 33 y.o. female.     33 year old female presents with constant left-sided chest pain that began this morning.  Pain is been gradually getting worse.  She has used Tums, aspirin, nitro without relief.  Does have a history of a mild heart attack in the past but according to her she had a negative heart catheterization.  No recent fever or chills.  Some pleuritic component to this.  No leg pain or swelling.  No recent cough or congestion.  Symptoms not worse with exertion.     Past Medical History:  Diagnosis Date  . Anxiety   . Depression   . Heart attack (HCC) 2007   r/t birth control pills at age 21 yr old, no problems since  . Low back pain   . Sleep apnea    uses cpap nightly    There are no active problems to display for this patient.   Past Surgical History:  Procedure Laterality Date  . CARDIAC CATHETERIZATION  2007   Hx heart attck r/t birth control pills - In [place], no problems since  . DILATATION & CURETTAGE/HYSTEROSCOPY WITH MYOSURE N/A 04/28/2017   Procedure: DILATATION & CURETTAGE/HYSTEROSCOPY WITH MYOSURE;  Surgeon: Ranae Pila, MD;  Location: [facility];  Service: Gynecology;  Laterality: N/A;  patient has history of MI in 2007I; cardiologist is Renaldo at [facility]  . WISDOM TOOTH EXTRACTION     IN [place]     OB History   No obstetric history on file.      Home Medications    Prior to Admission medications   Medication Sig Start Date End Date Taking? Authorizing Provider  clonazePAM (KLONOPIN) 0.5 MG tablet Take 0.5 mg by mouth 2 (two) times daily as needed for anxiety.    [provider]  cyclobenzaprine (FLEXERIL) 5 MG tablet Take 5 mg by mouth 3 (three) times daily as needed for muscle  spasms.    [provider]  escitalopram (LEXAPRO) 20 MG tablet Take 20 mg by mouth daily.    [provider]  ibuprofen (ADVIL,MOTRIN) 200 MG tablet Take 600 mg by mouth every 6 (six) hours as needed for headache or moderate pain.    [provider]  meloxicam (MOBIC) 15 MG tablet Take 15 mg by mouth daily as needed for pain.    [provider]  Multiple Vitamins-Calcium (ONE-A-DAY WOMENS PO) Take 1 tablet by mouth daily.    [provider]    Family History Family History  Problem Relation Age of Onset  . Hyperlipidemia Mother   . Hypertension Mother   . Heart disease Maternal Grandmother   . Hyperlipidemia Maternal Grandmother   . Hypertension Maternal Grandmother   . Stroke Maternal Grandmother   . Cancer Maternal Grandfather     Social History Social History   Tobacco Use  . Smoking status: Never Smoker  . Smokeless tobacco: Never Used  Substance Use Topics  . Alcohol use: Yes    Alcohol/week: 3.0 - 4.0 standard drinks    Types: 3 - 4 Glasses of wine per week  . Drug use: No     Allergies   Latex   Review of Systems Review of Systems  All other systems reviewed and  are negative.    Physical Exam Updated Vital Signs BP (!) 142/72 (BP Location: Right Arm)   Pulse 96   Temp 98.4 F (36.9 C) (Oral)   Resp 18   Ht 1.549 m (5\' 1" )   Wt 97.5 kg   LMP 03/06/2018   SpO2 98%   BMI 40.62 kg/m   Physical Exam Vitals signs and nursing note reviewed.  Constitutional:      General: She is not in acute distress.    Appearance: Normal appearance. She is well-developed. She is not toxic-appearing.  HENT:     Head: Normocephalic and atraumatic.  Eyes:     General: Lids are normal.     Conjunctiva/sclera: Conjunctivae normal.     Pupils: Pupils are equal, round, and reactive to light.  Neck:     Musculoskeletal: Normal range of motion and neck supple.     Thyroid: No thyroid mass.     Trachea: No tracheal deviation.    Cardiovascular:     Rate and Rhythm: Normal rate and regular rhythm.     Heart sounds: Normal heart sounds. No murmur. No gallop.   Pulmonary:     Effort: Pulmonary effort is normal. No respiratory distress.     Breath sounds: Normal breath sounds. No stridor. No decreased breath sounds, wheezing, rhonchi or rales.  Abdominal:     General: Bowel sounds are normal. There is no distension.     Palpations: Abdomen is soft.     Tenderness: There is no abdominal tenderness. There is no rebound.  Musculoskeletal: Normal range of motion.        General: No tenderness.  Skin:    General: Skin is warm and dry.     Findings: No abrasion or rash.  Neurological:     Mental Status: She is alert and oriented to person, place, and time.     GCS: GCS eye subscore is 4. GCS verbal subscore is 5. GCS motor subscore is 6.     Cranial Nerves: No cranial nerve deficit.     Sensory: No sensory deficit.  Psychiatric:        Speech: Speech normal.        Behavior: Behavior normal.      ED Treatments / Results  Labs (all labs ordered are listed, but only abnormal results are displayed) Labs Reviewed  CBC WITH DIFFERENTIAL/PLATELET  COMPREHENSIVE METABOLIC PANEL  LIPASE, BLOOD  I-STAT TROPONIN, ED    EKG None ED ECG REPORT   Date: 03/09/2018  Rate: 105  Rhythm: sinus tachycardia  QRS Axis: normal  Intervals: normal  ST/T Wave abnormalities: nonspecific ST changes  Conduction Disutrbances:none  Narrative Interpretation:   Old EKG Reviewed: none available  I have personally reviewed the EKG tracing and agree with the computerized printout as noted.  Radiology No results found.  Procedures Procedures (including critical care time)  Medications Ordered in ED Medications  0.9 %  sodium chloride infusion (has no administration in time range)  LORazepam (ATIVAN) injection 1 mg (has no administration in time range)     Initial Impression / Assessment and Plan / ED Course  I have  reviewed the triage vital signs and the nursing notes.  Pertinent labs & imaging results that were available during my care of the patient were reviewed by me and considered in my medical decision making (see chart for details).        Patient is EKG without acute ischemic findings.  Several pulmonary embolism and patient had  a CT of the chest which is negative for PE.  Treated with Ativan as well as Toradol and feels much better at this time.  Delta troponin negative.  Low suspicion for ACS as pain is worse with movement.  Final Clinical Impressions(s) / ED Diagnoses   Final diagnoses:  None    ED Discharge Orders    None       Lorre Nick, MD 03/09/18 1342

## 2018-03-09 NOTE — ED Notes (Signed)
IV team paged about stat IV order placed at 938.

## 2018-03-09 NOTE — ED Notes (Signed)
Pts sister upset patient was moved to hallway bed with chest pain and left arm pain. Explained to family pt was progressing to hallway to wait for CT and would be on telemetry while waiting. Dr. Freida Busman made aware of same.

## 2018-03-09 NOTE — ED Notes (Signed)
IV team at bedside 

## 2018-03-09 NOTE — ED Triage Notes (Signed)
Pt arrives via POV from workplace with CP that began at 630 this morning on waking. Pt reports she went to work with the pain, the pain increased and radiated to left arm while at work. Took 324mg  ASA and 1 SL nitro given by coworker with no change in pain. VSS.

## 2018-03-09 NOTE — ED Notes (Signed)
Patient transported to CT 

## 2023-07-25 ENCOUNTER — Ambulatory Visit

## 2023-07-25 NOTE — Progress Notes (Deleted)
 @Patient  ID: Beth Merritt, female    DOB: 16-Jun-1985, 38 y.o.   MRN: [id number]  No chief complaint on file.   Referring provider: Baird Comer GAILS, NP  HPI: Beth Merritt is a 38 y/o female with PMH of OSA on CPAP previously followed by Dr. Javaid who presents today to establish care.  TEST/EVENTS :   Allergies  Allergen Reactions   Latex Rash    Immunization History  Administered Date(s) Administered   Pfizer Covid-19 Vaccine Bivalent Booster 68yrs & up 09/25/2020    Past Medical History:  Diagnosis Date   Anxiety    Depression    Heart attack (HCC) 2007   r/t birth control pills at age 76 yr old, no problems since   Low back pain    Sleep apnea    uses cpap nightly    Tobacco History: Social History   Tobacco Use  Smoking Status Never  Smokeless Tobacco Never   Counseling given: Not Answered   Outpatient Medications Prior to Visit  Medication Sig Dispense Refill   clonazePAM (KLONOPIN) 0.5 MG tablet Take 0.5 mg by mouth 2 (two) times daily as needed for anxiety.     cyclobenzaprine (FLEXERIL) 5 MG tablet Take 5 mg by mouth 3 (three) times daily as needed for muscle spasms.     diazepam  (VALIUM ) 2 MG tablet Take 1 tablet (2 mg total) by mouth every 6 (six) hours as needed for muscle spasms. 15 tablet 0   escitalopram (LEXAPRO) 20 MG tablet Take 20 mg by mouth daily.     ibuprofen  (ADVIL ,MOTRIN ) 200 MG tablet Take 600 mg by mouth every 6 (six) hours as needed for headache or moderate pain.     meloxicam (MOBIC) 15 MG tablet Take 15 mg by mouth daily as needed for pain.     Multiple Vitamins-Calcium (ONE-A-DAY WOMENS PO) Take 1 tablet by mouth daily.     No facility-administered medications prior to visit.     Review of Systems:   Constitutional:   No  weight loss, night sweats,  Fevers, chills, fatigue, or  lassitude.  HEENT:   No headaches,  Difficulty swallowing,  Tooth/dental problems, or  Sore throat,                No sneezing, itching, ear ache,  nasal congestion, post nasal drip,   CV:  No chest pain,  Orthopnea, PND, swelling in lower extremities, anasarca, dizziness, palpitations, syncope.   GI  No heartburn, indigestion, abdominal pain, nausea, vomiting, diarrhea, change in bowel habits, loss of appetite, bloody stools.   Resp: No shortness of breath with exertion or at rest.  No excess mucus, no productive cough,  No non-productive cough,  No coughing up of blood.  No change in color of mucus.  No wheezing.  No chest wall deformity  Skin: no rash or lesions.  GU: no dysuria, change in color of urine, no urgency or frequency.  No flank pain, no hematuria   MS:  No joint pain or swelling.  No decreased range of motion.  No back pain.    Physical Exam  There were no vitals taken for this visit.  GEN: A/Ox3; pleasant , NAD, well nourished    HEENT:  Rainbow/AT,  EACs-clear, TMs-wnl, NOSE-clear, THROAT-clear, no lesions, no postnasal drip or exudate noted.   NECK:  Supple w/ fair ROM; no JVD; normal carotid impulses w/o bruits; no thyromegaly or nodules palpated; no lymphadenopathy.    RESP  Clear  P &  A; w/o, wheezes/ rales/ or rhonchi. no accessory muscle use, no dullness to percussion  CARD:  RRR, no m/r/g, no peripheral edema, pulses intact, no cyanosis or clubbing.  GI:   Soft & nt; nml bowel sounds; no organomegaly or masses detected.   Musco: Warm bil, no deformities or joint swelling noted.   Neuro: alert, no focal deficits noted.    Skin: Warm, no lesions or rashes    Lab Results:  CBC    Component Value Date/Time   WBC 8.0 03/09/2018 0942   RBC 4.20 03/09/2018 0942   HGB 11.3 (L) 03/09/2018 0942   HCT 36.6 03/09/2018 0942   PLT 429 (H) 03/09/2018 0942   MCV 87.1 03/09/2018 0942   MCH 26.9 03/09/2018 0942   MCHC 30.9 03/09/2018 0942   RDW 13.1 03/09/2018 0942   LYMPHSABS 2.6 03/09/2018 0942   MONOABS 0.7 03/09/2018 0942   EOSABS 0.3 03/09/2018 0942   BASOSABS 0.0 03/09/2018 0942    BMET     Component Value Date/Time   NA 134 (L) 03/09/2018 0942   K 4.1 03/09/2018 0942   CL 104 03/09/2018 0942   CO2 20 (L) 03/09/2018 0942   GLUCOSE 113 (H) 03/09/2018 0942   BUN 10 03/09/2018 0942   CREATININE 0.77 03/09/2018 0942   CALCIUM 9.1 03/09/2018 0942   GFRNONAA >60 03/09/2018 0942   GFRAA >60 03/09/2018 0942    BNP No results found for: BNP  ProBNP No results found for: PROBNP  Imaging: No results found.  Administration History     None           No data to display          No results found for: NITRICOXIDE   Assessment & Plan:   Assessment & Plan    No follow-ups on file.  Candis Dandy, PA-C 07/25/2023
